# Patient Record
Sex: Male | Born: 2000 | Race: White | Hispanic: No | Marital: Single | Smoking: Never smoker
Health system: Southern US, Community
[De-identification: ages and names within clinical notes are randomized; demographics above are authoritative.]

## PROBLEM LIST (undated history)

## (undated) DIAGNOSIS — F909 Attention-deficit hyperactivity disorder, unspecified type: Secondary | ICD-10-CM

## (undated) DIAGNOSIS — G43909 Migraine, unspecified, not intractable, without status migrainosus: Secondary | ICD-10-CM

## (undated) DIAGNOSIS — M92529 Juvenile osteochondrosis of tibia tubercle, unspecified leg: Secondary | ICD-10-CM

## (undated) DIAGNOSIS — F419 Anxiety disorder, unspecified: Secondary | ICD-10-CM

## (undated) DIAGNOSIS — J302 Other seasonal allergic rhinitis: Secondary | ICD-10-CM

## (undated) DIAGNOSIS — R159 Full incontinence of feces: Secondary | ICD-10-CM

## (undated) HISTORY — DX: Juvenile osteochondrosis of tibia tubercle, unspecified leg: M92.529

## (undated) HISTORY — DX: Anxiety disorder, unspecified: F41.9

## (undated) HISTORY — DX: Other seasonal allergic rhinitis: J30.2

## (undated) HISTORY — DX: Migraine, unspecified, not intractable, without status migrainosus: G43.909

## (undated) HISTORY — DX: Full incontinence of feces: R15.9

---
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
## 2022-01-06 ENCOUNTER — Encounter: Payer: Self-pay | Admitting: Family Medicine

## 2022-01-06 ENCOUNTER — Telehealth (INDEPENDENT_AMBULATORY_CARE_PROVIDER_SITE_OTHER): Payer: No Typology Code available for payment source | Admitting: Medical

## 2022-01-06 VITALS — Temp 97.5°F

## 2022-01-06 DIAGNOSIS — J069 Acute upper respiratory infection, unspecified: Secondary | ICD-10-CM

## 2022-01-06 NOTE — Progress Notes (Signed)
Subjective:     Patient ID: Miguel Kim, male   DOB: [birth year], 21 y.o.   MRN: [id number]  This visit type was conducted due to national recommendations for restrictions regarding the COVID-19 Pandemic (e.g. social distancing) in an effort to limit this patient's exposure and mitigate transmission in our community.  Due to their co-morbid illnesses, this patient is at least at moderate risk for complications without adequate follow up.  This format is felt to be most appropriate for this patient at this time.    Documentation for virtual audio and video telecommunications through [facility] encounter:  The patient was located at home. The provider was located in the office. The patient did consent to this visit and is aware of possible charges through their insurance for this visit.  The other persons participating in this telemedicine service were none. Time spent on call was 20 minutes and in review of previous records 20 minutes total.  This virtual service is not related to other E/M service within previous 7 days.   HPI Chief Complaint  Patient presents with   Sore Throat    Sore throat pain level 4/10 and runny nose started this morning. Covid negative this morning.    Virtual for illness.  Mother was sick a week ago.  He has same symptoms that she had.  Syptoms began today.   He notes sore throat, runny nose, felt fatigue and run down.   No fever.  Has had chills.  No NVD.   Has a little cough.  Has had some headache.  No sinus pressure, no ear pain, no rash.    Last week mother was sick with URI symptoms.  Using cough drops and some ibuprofen.    No other aggravating or relieving factors. No other complaint.  Past Medical History:  Diagnosis Date   ADHD (attention deficit hyperactivity disorder)    diagnosed age 44   Anxiety    Encopresis    Migraines    h/o abdominal migraines, now gets HA   Osgood-Schlatter's disease    right knee, 7th-8th grade   Seasonal  allergies    Current Outpatient Medications on File Prior to Visit  Medication Sig Dispense Refill   busPIRone (BUSPAR) 7.5 MG tablet Take 7.5 mg by mouth 3 (three) times daily.     ibuprofen (ADVIL) 200 MG tablet Take 400 mg by mouth every 6 (six) hours as needed.     methylphenidate 36 MG PO CR tablet Take 1 tablet (36 mg total) by mouth daily. 30 tablet 0   No current facility-administered medications on file prior to visit.     Review of Systems As in subjective    Objective:   Physical Exam Due to coronavirus pandemic stay at home measures, patient visit was virtual and they were not examined in person.    Temp (!) 97.5 F (36.4 C)   General: Well-developed well-nourished no acute distress No labored breathing or witnessed wheezing Answers questions appropriately     Assessment:     Encounter Diagnosis  Name Primary?   Upper respiratory tract infection, unspecified type Yes       Plan:     We discussed symptoms and concerns.  He is not able to come up to our office for flu or COVID testing today.  He did do a home COVID test that was negative  We discussed that we are seeing a lot of different illness in the community right now including flu, COVID, pneumonia, colds.  He seems to be young and healthy guy and symptoms are present for 1 day suggesting a cold.  I recommend rest, hydration, over-the-counter medicine for congestion and cough such as Robitussin-DM or DayQuil NyQuil combo.  He can use ibuprofen or Tylenol over-the-counter for aches and sore throat or fever.  Salt water gargles for sore throats.  He can use over-the-counter Zicam or emergenC vitamin pack.  We discussed the importance of quarantine for now in the event this is flu.  Work note will be emailed to him  If much worse over the weekend or new symptoms then get reevaluated or call after-hours line   [name] was seen today for sore throat.  Diagnoses and all orders for this visit:  Upper  respiratory tract infection, unspecified type  F/u prn

## 2022-07-11 ENCOUNTER — Telehealth: Payer: Self-pay

## 2022-07-11 NOTE — Telephone Encounter (Signed)
LVM for patient to call back [phone], or to call PCP office to schedule follow up apt. AS, CMA  

## 2022-09-06 ENCOUNTER — Encounter: Payer: Self-pay | Admitting: Family Medicine

## 2022-09-06 NOTE — Progress Notes (Deleted)
No chief complaint on file.  Patient presents to discuss possible depression, f/u on ADHD.  He was seen here just once in 03/2021.  He had cancelled his CPE due to work schedule, and never rescheduled (attempted to call multiple times when we had cancellations).   ADHD: at his 2023 visit he reported taking methylphenidate x 2 years.  Prev took Vyvanse, changed due to increased agitation as med was wearing off (decreased appetite more, change in personality). Doesn't normally take it on the weekends, unless working.  He reported doing well on Concerta 36mg , and this was prescribed #30 in 03/2021. A refill was sent in 04/2021, but was never picked up by the patient.   Anxiety--started on Buspar during the pandemic, 2020-2021.  In 2023 he reported this was helpful, and was also getting counseling. Buspar was never filled by this office.    Did he move back home?   PMH, PSH, SH reviewed   ROS:   PHYSICAL EXAM:  There were no vitals taken for this visit.  Wt Readings from Last 3 Encounters:  03/16/21 172 lb 9.6 oz (78.3 kg) (77%, Z= 0.73)*  02/03/16 110 lb 3.2 oz (50 kg) (49%, Z= -0.02)*  12/29/15 110 lb 6.4 oz (50.1 kg) (52%, Z= 0.04)*   * Growth percentiles are based on CDC (Boys, 2-20 Years) data.      ASSESSMENT/PLAN:  GAD-7 Phq-9

## 2022-09-07 ENCOUNTER — Encounter: Payer: No Typology Code available for payment source | Admitting: Family Medicine

## 2023-02-10 DIAGNOSIS — R519 Headache, unspecified: Secondary | ICD-10-CM | POA: Diagnosis not present

## 2023-02-10 DIAGNOSIS — F411 Generalized anxiety disorder: Secondary | ICD-10-CM | POA: Diagnosis not present

## 2023-02-10 DIAGNOSIS — G4710 Hypersomnia, unspecified: Secondary | ICD-10-CM | POA: Diagnosis not present

## 2023-02-10 DIAGNOSIS — F909 Attention-deficit hyperactivity disorder, unspecified type: Secondary | ICD-10-CM | POA: Diagnosis not present

## 2023-03-09 DIAGNOSIS — R059 Cough, unspecified: Secondary | ICD-10-CM | POA: Diagnosis not present

## 2023-03-09 DIAGNOSIS — Z23 Encounter for immunization: Secondary | ICD-10-CM | POA: Diagnosis not present

## 2023-03-09 DIAGNOSIS — Z6822 Body mass index (BMI) 22.0-22.9, adult: Secondary | ICD-10-CM | POA: Diagnosis not present

## 2023-03-09 DIAGNOSIS — R051 Acute cough: Secondary | ICD-10-CM | POA: Diagnosis not present

## 2023-03-09 DIAGNOSIS — J069 Acute upper respiratory infection, unspecified: Secondary | ICD-10-CM | POA: Diagnosis not present

## 2023-03-12 DIAGNOSIS — R051 Acute cough: Secondary | ICD-10-CM | POA: Diagnosis not present

## 2023-03-12 DIAGNOSIS — R0981 Nasal congestion: Secondary | ICD-10-CM | POA: Diagnosis not present

## 2023-03-12 DIAGNOSIS — J111 Influenza due to unidentified influenza virus with other respiratory manifestations: Secondary | ICD-10-CM | POA: Diagnosis not present

## 2023-03-30 ENCOUNTER — Other Ambulatory Visit: Payer: Self-pay | Admitting: Family Medicine

## 2023-04-26 DIAGNOSIS — F902 Attention-deficit hyperactivity disorder, combined type: Secondary | ICD-10-CM | POA: Diagnosis not present

## 2023-04-26 DIAGNOSIS — F419 Anxiety disorder, unspecified: Secondary | ICD-10-CM | POA: Diagnosis not present

## 2023-07-20 DIAGNOSIS — Z6823 Body mass index (BMI) 23.0-23.9, adult: Secondary | ICD-10-CM | POA: Diagnosis not present

## 2023-07-20 DIAGNOSIS — J029 Acute pharyngitis, unspecified: Secondary | ICD-10-CM | POA: Diagnosis not present

## 2023-08-24 DIAGNOSIS — D2272 Melanocytic nevi of left lower limb, including hip: Secondary | ICD-10-CM | POA: Diagnosis not present

## 2023-08-24 DIAGNOSIS — D2262 Melanocytic nevi of left upper limb, including shoulder: Secondary | ICD-10-CM | POA: Diagnosis not present
